# Patient Record
Sex: Male | Born: 1990 | Race: Black or African American | Hispanic: No | Marital: Single | Smoking: Current every day smoker
Health system: Southern US, Community
[De-identification: ages and names within clinical notes are randomized; demographics above are authoritative.]

---

## 2013-12-08 ENCOUNTER — Encounter (HOSPITAL_COMMUNITY): Payer: Self-pay

## 2013-12-08 ENCOUNTER — Emergency Department (HOSPITAL_COMMUNITY)
Admission: EM | Admit: 2013-12-08 | Discharge: 2013-12-08 | Disposition: A | Discharge status: Home / Self Care | Payer: BC Managed Care – PPO | Attending: Emergency Medicine | Admitting: Emergency Medicine

## 2013-12-08 DIAGNOSIS — J029 Acute pharyngitis, unspecified: Secondary | ICD-10-CM | POA: Diagnosis present

## 2013-12-08 DIAGNOSIS — K122 Cellulitis and abscess of mouth: Secondary | ICD-10-CM | POA: Diagnosis not present

## 2013-12-08 LAB — RAPID STREP SCREEN (MED CTR MEBANE ONLY): Streptococcus, Group A Screen (Direct): NEGATIVE

## 2013-12-08 MED ORDER — PREDNISONE 10 MG PO TABS: 20.0000 mg | ORAL_TABLET | Freq: Every day | ORAL | Status: AC

## 2013-12-08 MED ORDER — AMOXICILLIN-POT CLAVULANATE 875-125 MG PO TABS
1.0000 | ORAL_TABLET | Freq: Once | ORAL | Status: AC
  Administered 2013-12-08: 1 via ORAL
  Filled 2013-12-08: qty 1

## 2013-12-08 MED ORDER — DIPHENHYDRAMINE HCL 25 MG PO CAPS
50.0000 mg | ORAL_CAPSULE | Freq: Once | ORAL | Status: AC
  Administered 2013-12-08: 50 mg via ORAL
  Filled 2013-12-08: qty 2

## 2013-12-08 MED ORDER — AMOXICILLIN-POT CLAVULANATE 875-125 MG PO TABS: 1.0000 | ORAL_TABLET | Freq: Two times a day (BID) | ORAL | Status: DC

## 2013-12-08 MED ORDER — PREDNISONE 20 MG PO TABS
60.0000 mg | ORAL_TABLET | Freq: Once | ORAL | Status: AC
  Administered 2013-12-08: 60 mg via ORAL
  Filled 2013-12-08: qty 3

## 2013-12-08 NOTE — ED Provider Notes (Signed)
CSN: [id number]     Arrival date & time 12/08/13  0510 History   First MD Initiated Contact with Patient 12/08/13 [phone]     Chief Complaint  Patient presents with  . Sore Throat     (Consider location/radiation/quality/duration/timing/severity/associated sxs/prior Treatment) HPI 23 y.o. Male with complaints of sore throat.  Patient states he awoke his am with sore throat worse when he lays back.  It is moderate in severity.  He is able to swallow and breath without difficulty.  He felt ok when he went to sleep last night with no known sick exposures although he lives in a dorm.  He denies fever, headache, nasal congestion, cough, dyspnea, or muscle aches, or exposure to allergens.  He has not had a similar episode in the past.  He took otc ibuprofen without relief.   History reviewed. No pertinent past medical history. History reviewed. No pertinent past surgical history. History reviewed. No pertinent family history. History  Substance Use Topics  . Smoking status: Never Smoker   . Smokeless tobacco: Not on file  . Alcohol Use: No    Review of Systems  All other systems reviewed and are negative.     Allergies  Review of patient's allergies indicates no known allergies.  Home Medications   Prior to Admission medications   Medication Sig Start Date End Date Taking? Authorizing Provider  ibuprofen (ADVIL,MOTRIN) 200 MG tablet Take 600 mg by mouth every 6 (six) hours as needed for moderate pain.   Yes Historical Provider, MD   BP 144/65 mmHg  Pulse 63  Temp(Src) 98 F (36.7 C) (Oral)  Resp 20  Ht 5\' 11"  (1.803 m)  Wt 205 lb (92.987 kg)  BMI 28.60 kg/m2  SpO2 100% Physical Exam  Constitutional: He appears well-developed and well-nourished.  HENT:  Head: Normocephalic and atraumatic.  Right Ear: External ear normal.  Left Ear: External ear normal.  Mouth/Throat: Uvula is midline and mucous membranes are normal. Posterior oropharyngeal erythema present. No oropharyngeal  exudate, posterior oropharyngeal edema or tonsillar abscesses.    Uvula swollen without pus with some surrounding erythema Diffuse shotty adenopathy  Nursing note and vitals reviewed.   ED Course  Procedures (including critical care time) Labs Review Labs Reviewed - No data to display  Imaging Review No results found.   EKG Interpretation None      MDM   Final diagnoses:  Uvulitis   Plan prednisone and pcn.  This appears most c.w. Infectious etiology,  likely viral, but wii cover for strep and hib.   will also be covering for possible allergic reaction.  I have discussed return precautions and need for follow up with.  Patient voices understanding. Patient's voice is clear with airway intact.  No evidence of epiglottitis with erythema ending in pharynx on visual exam.     Hilario Quarryanielle S Teara Duerksen, MD 12/09/13 1102

## 2013-12-08 NOTE — Discharge Instructions (Signed)
Uvulitis  Uvulitis is redness and soreness (inflammation) of the uvula. The uvula is the small tongue-shaped piece of tissue in the back of your mouth.   CAUSES  Infection is a common cause of uvulitis. Infection of the uvula can be either viral or bacterial. Infectious uvulitis usually only occurs in association with another condition, such as inflammation and infection of the mouth or throat.   Other causes of uvulitis include:  · Trauma to the uvula.  · Swelling from excess fluid buildup (edema), which may be an allergic reaction.  · Inhalation of irritants, such as chemical agents, smoke, or steam.  DIAGNOSIS  Your caregiver can usually diagnose uvulitis through a physical examination. Bacterial uvulitis can be diagnosed through the results of the growth of samples of bodily substances taken from your mouth (cultures).  HOME CARE INSTRUCTIONS   · Rest as much as possible.  · Young children may suck on frozen juice bars or frozen ice pops. Older children and adults may gargle with a warm or cold liquid to help soothe the throat. (Mix ¼ tsp of salt in 8 oz of water, or use strong tea.)  · Use a cool-mist humidifier to lessen throat irritation and cough.  · Drink enough fluids to keep your urine clear or pale yellow.  · While the throat is very sore, eat soft or liquid foods such as milk, ice cream, soups, or milk drinks.  · Family members who develop a sore throat or fever should have a medical exam or throat culture.  · If your child has uvulitis and is taking antibiotic medicine, wait 24 hours or until his or her temperature is near normal (less than 100° F [37.8° C]) before allowing him or her to return to school or day care.  · Only take over-the-counter or prescription medicines for pain, discomfort, or fever as directed by your caregiver.  Ask when your test results will be ready. Make sure you get your test results.   SEEK MEDICAL CARE IF:   · You have an oral temperature above 102° F (38.9° C).  · You  develop large, tender lumps your the neck.  · Your child develops a rash.  · You cough up green, yellow-brown, or bloody substances.  SEEK IMMEDIATE MEDICAL CARE IF:   · You develop any new symptoms, such as vomiting, earache, severe headache, stiff neck, chest pain, or trouble breathing or swallowing.  · Your airway is blocked.  · You develop more severe throat pain along with drooling or voice changes.  Document Released: 08/21/2003 Document Revised: 04/04/2011 Document Reviewed: 03/18/2010  Exit[employer]® Patient Information ©2015 [employer]. This information is not intended to replace advice given to you by your health care provider. Make sure you discuss any questions you have with your health care provider.

## 2013-12-08 NOTE — ED Notes (Signed)
[facility], pt here for sore throat since yesterday. No fever. Throat is red.

## 2013-12-10 LAB — CULTURE, GROUP A STREP

## 2013-12-11 ENCOUNTER — Telehealth (HOSPITAL_COMMUNITY): Payer: Self-pay

## 2013-12-11 NOTE — ED Notes (Signed)
Post ED Visit - Positive Culture Follow-up  Culture report reviewed by antimicrobial stewardship pharmacist: []  Wes Dulaney, Pharm.D., BCPS [x]  Celedonio MiyamotoJeremy Frens, Pharm.D., BCPS []  Georgina PillionElizabeth Martin, Pharm.D., BCPS []  SimmsMinh Pham, [address]Ph[address]D., BCPS, AAHIVP []  Estella HuskMichelle Turner, Pharm.D., BCPS, AAHIVP []  Babs BertinHaley Baird, [address]Pharm[address]   Positive throat culture Treated with amoxicillin, organism sensitive to the same and no further patient follow-up is required at this time.  Ashley JacobsFesterman, Ericson Nafziger C 12/11/2013, 2:03 PM

## 2015-11-28 ENCOUNTER — Encounter (HOSPITAL_COMMUNITY): Payer: Self-pay | Admitting: Emergency Medicine

## 2015-11-28 ENCOUNTER — Ambulatory Visit (HOSPITAL_COMMUNITY)
Admission: EM | Admit: 2015-11-28 | Discharge: 2015-11-28 | Disposition: A | Discharge status: Home / Self Care | Payer: BLUE CROSS/BLUE SHIELD | Attending: Emergency Medicine | Admitting: Emergency Medicine

## 2015-11-28 DIAGNOSIS — R109 Unspecified abdominal pain: Secondary | ICD-10-CM

## 2015-11-28 NOTE — ED Provider Notes (Signed)
CSN: [id number]     Arrival date & time 11/28/15  1947 History   First MD Initiated Contact with Patient 11/28/15 2046     Chief Complaint  Patient presents with  . Flank Pain   (Consider location/radiation/quality/duration/timing/severity/associated sxs/prior Treatment) 25 year old male presents with pain to the right lateral abdomen radiating to the right anterior abdomen 2 days ago. He states that the prior day he was playing basketball but does not remember a specific injury to the abdomen. The pain is along the right costal margin to the right iliac crest and medially. It is positional and certain movements exacerbate the pain concern movements help relieve the pain. He states he can touch several areas and produce tenderness.      History reviewed. No pertinent past medical history. History reviewed. No pertinent surgical history. History reviewed. No pertinent family history. Social History  Substance Use Topics  . Smoking status: Current Every Day Smoker  . Smokeless tobacco: Never Used  . Alcohol use Yes    Review of Systems  Constitutional: Negative.   HENT: Negative.   Respiratory: Negative.  Negative for cough, chest tightness and shortness of breath.   Cardiovascular: Negative for chest pain.  Gastrointestinal: Positive for abdominal pain. Negative for abdominal distention, blood in stool, diarrhea, nausea and vomiting.  Genitourinary: Negative.   Musculoskeletal: Negative.   Skin: Negative.   Neurological: Negative.     Allergies  Review of patient's allergies indicates no known allergies.  Home Medications   Prior to Admission medications   Medication Sig Start Date End Date Taking? Authorizing Provider  ibuprofen (ADVIL,MOTRIN) 200 MG tablet Take 600 mg by mouth every 6 (six) hours as needed for moderate pain.    Historical Provider, MD   Meds Ordered and Administered this Visit  Medications - No data to display  BP (!) 128/49 (BP Location: Right Arm)    Pulse 72   Temp 98.4 F (36.9 C) (Oral)   Resp 18   SpO2 97%  No data found.   Physical Exam  Constitutional: He is oriented to person, place, and time. He appears well-developed and well-nourished. No distress.  HENT:  Head: Normocephalic and atraumatic.  Neck: Normal range of motion. Neck supple.  Cardiovascular: Normal rate, regular rhythm and normal heart sounds.   Pulmonary/Chest: Effort normal and breath sounds normal. No respiratory distress.  Abdominal: Soft.  Tenderness over the right iliac crest and right costal margin. Tenderness posterior along the posterior axillary line and musculature of the right back. There is also tenderness medially just right of the mid sagittal line of the abdomen. No rebound or guarding.  Musculoskeletal: Normal range of motion. He exhibits no edema or deformity.  Neurological: He is alert and oriented to person, place, and time.  Skin: Skin is warm.  Psychiatric: He has a normal mood and affect.  Nursing note and vitals reviewed.   Urgent Care Course   Clinical Course    Procedures (including critical care time)  Labs Review Labs Reviewed - No data to display  Imaging Review No results found.   Visual Acuity Review  Right Eye Distance:   Left Eye Distance:   Bilateral Distance:    Right Eye Near:   Left Eye Near:    Bilateral Near:         MDM   1. Right flank pain   2. Abdominal wall pain in right flank    The most likely explanation for her pain is abdominal wall and muscle  type pain. Apply heat to the area pain and may take Tylenol or ibuprofen for pain. If there is any worsening of pain if you develop nausea vomiting, blood in the stool, dark hard stools or any bleeding, complete lack of appetite, fever or worsening in any way go to the emergency department promptly.     Hayden Rasmussenavid Truc Winfree, NP 11/28/15 2110    Hayden Rasmussenavid Cortez Flippen, NP 11/28/15 2118

## 2015-11-28 NOTE — Discharge Instructions (Signed)
The most likely explanation for her pain is abdominal wall and muscle type pain. Apply heat to the area pain and may take Tylenol or ibuprofen for pain. If there is any worsening of pain if you develop nausea vomiting, blood in the stool, dark hard stools or any bleeding, complete lack of appetite, fever or worsening in any way go to the emergency department promptly.

## 2015-11-28 NOTE — ED Triage Notes (Signed)
Here for constant right flank pain onset 2-3 days  Reports it increases w/activity  Denies inj/trauma, urinary sx  A&O x4... NAD

## 2015-12-03 ENCOUNTER — Encounter (HOSPITAL_COMMUNITY): Payer: Self-pay

## 2015-12-03 ENCOUNTER — Emergency Department (HOSPITAL_COMMUNITY)
Admission: EM | Admit: 2015-12-03 | Discharge: 2015-12-04 | Disposition: A | Discharge status: Home / Self Care | Payer: BLUE CROSS/BLUE SHIELD | Attending: Emergency Medicine | Admitting: Emergency Medicine

## 2015-12-03 DIAGNOSIS — R1084 Generalized abdominal pain: Secondary | ICD-10-CM | POA: Diagnosis not present

## 2015-12-03 DIAGNOSIS — R1013 Epigastric pain: Secondary | ICD-10-CM | POA: Diagnosis present

## 2015-12-03 DIAGNOSIS — F17200 Nicotine dependence, unspecified, uncomplicated: Secondary | ICD-10-CM | POA: Diagnosis not present

## 2015-12-03 LAB — COMPREHENSIVE METABOLIC PANEL
ALBUMIN: 4 g/dL (ref 3.5–5.0)
ALK PHOS: 89 U/L (ref 38–126)
ALT: 14 U/L — ABNORMAL LOW (ref 17–63)
AST: 20 U/L (ref 15–41)
Anion gap: 5 (ref 5–15)
BUN: 13 mg/dL (ref 6–20)
CALCIUM: 9.3 mg/dL (ref 8.9–10.3)
CO2: 28 mmol/L (ref 22–32)
Chloride: 108 mmol/L (ref 101–111)
Creatinine, Ser: 1.08 mg/dL (ref 0.61–1.24)
GFR calc Af Amer: >60 mL/min (ref >60)
GFR calc non Af Amer: >60 mL/min (ref >60)
GLUCOSE: 104 mg/dL — AB (ref 65–99)
POTASSIUM: 4.1 mmol/L (ref 3.5–5.1)
SODIUM: 141 mmol/L (ref 135–145)
Total Bilirubin: 0.8 mg/dL (ref 0.3–1.2)
Total Protein: 6.5 g/dL (ref 6.5–8.1)

## 2015-12-03 LAB — CBC
HCT: 40.9 % (ref 39.0–52.0)
HEMOGLOBIN: 13.3 g/dL (ref 13.0–17.0)
MCH: 27.7 pg (ref 26.0–34.0)
MCHC: 32.5 g/dL (ref 30.0–36.0)
MCV: 85 fL (ref 78.0–100.0)
Platelets: 125 10*3/uL — ABNORMAL LOW (ref 150–400)
RBC: 4.81 MIL/uL (ref 4.22–5.81)
RDW: 13.9 % (ref 11.5–15.5)
WBC: 5.8 10*3/uL (ref 4.0–10.5)

## 2015-12-03 LAB — LIPASE, BLOOD: Lipase: 31 U/L (ref 11–51)

## 2015-12-03 MED ORDER — ACETAMINOPHEN 500 MG PO TABS
1000.0000 mg | ORAL_TABLET | Freq: Once | ORAL | Status: AC
  Administered 2015-12-03: 1000 mg via ORAL
  Filled 2015-12-03: qty 2

## 2015-12-03 NOTE — ED Provider Notes (Signed)
[facility]-EMERGENCY DEPT Provider Note   CSN: [id number] Arrival date & time: 12/03/15  1843     History   Chief Complaint Chief Complaint  Patient presents with  . Flank Pain    HPI Adele BarthelMaurice Bernhart Jr. is a 25 y.o. male.Complains of epigastric pain radiating to lower abdomen onset one week ago. Pain waxes and wanes presently is mild. Gets worse with lying in certain positions and improved with lying in other positions. No anorexia no fever no nausea or vomiting last bowel movement this morning, normal no urinary symptoms. No treatment prior to coming here. No other associated symptoms  HPI  History reviewed. No pertinent past medical history. Past medical history negative There are no active problems to display for this patient.   History reviewed. No pertinent surgical history.     Home Medications    Prior to Admission medications   Medication Sig Start Date End Date Taking? Authorizing Provider  ibuprofen (ADVIL,MOTRIN) 200 MG tablet Take 600 mg by mouth every 6 (six) hours as needed for moderate pain.   Yes Historical Provider, MD    Family History No family history on file.  Social History Social History  Substance Use Topics  . Smoking status: Current Every Day Smoker  . Smokeless tobacco: Never Used  . Alcohol use Yes  Positive marijuana use   Allergies   Patient has no known allergies.   Review of Systems Review of Systems  Constitutional: Negative.   HENT: Negative.   Respiratory: Negative.   Cardiovascular: Negative for chest pain.       Syncope  Gastrointestinal: Positive for abdominal pain.  Musculoskeletal: Negative.   Skin: Negative.   Allergic/Immunologic: Negative.   Neurological: Negative.   Psychiatric/Behavioral: Negative.   All other systems reviewed and are negative.    Physical Exam Updated Vital Signs BP 145/87   Pulse (!) 55   Temp 98.6 F (37 C) (Oral)   Resp 18   Ht 5\' 11"  (1.803 m)   Wt 200 lb (90.7 kg)   SpO2 100%    BMI 27.89 kg/m   Physical Exam  Constitutional: He appears well-developed and well-nourished. No distress.  HENT:  Head: Normocephalic and atraumatic.  Eyes: Conjunctivae are normal. Pupils are equal, round, and reactive to light.  Neck: Neck supple. No tracheal deviation present. No thyromegaly present.  Cardiovascular: Normal rate and regular rhythm.   No murmur heard. Pulmonary/Chest: Effort normal and breath sounds normal.  Abdominal: Soft. Bowel sounds are normal. He exhibits no distension.  Minimal periumbilical tenderness  Genitourinary: Penis normal.  Genitourinary Comments: Scrotum normal. No flank tenderness  Musculoskeletal: Normal range of motion. He exhibits no edema or tenderness.  Neurological: He is alert. Coordination normal.  Skin: Skin is warm and dry. No rash noted.  Psychiatric: He has a normal mood and affect.  Nursing note and vitals reviewed.    ED Treatments / Results  Labs (all labs ordered are listed, but only abnormal results are displayed) Labs Reviewed  COMPREHENSIVE METABOLIC PANEL - Abnormal; Notable for the following:       Result Value   Glucose, Bld 104 (*)    ALT 14 (*)    All other components within normal limits  CBC - Abnormal; Notable for the following:    Platelets 125 (*)    All other components within normal limits  LIPASE, BLOOD  URINALYSIS, ROUTINE W REFLEX MICROSCOPIC (NOT AT [facility])    EKG  EKG Interpretation None  Radiology No results found.  Procedures Procedures (including critical care time)  Medications Ordered in ED Medications  acetaminophen (TYLENOL) tablet 1,000 mg (1,000 mg Oral Given 12/03/15 2339)    X-rays viewed by me Results for orders placed or performed during the hospital encounter of 12/03/15  Lipase, blood  Result Value Ref Range   Lipase 31 11 - 51 U/L  Comprehensive metabolic panel  Result Value Ref Range   Sodium 141 135 - 145 mmol/L   Potassium 4.1 3.5 - 5.1 mmol/L   Chloride  108 101 - 111 mmol/L   CO2 28 22 - 32 mmol/L   Glucose, Bld 104 (H) 65 - 99 mg/dL   BUN 13 6 - 20 mg/dL   Creatinine, Ser 1.61 0.61 - 1.24 mg/dL   Calcium 9.3 8.9 - 09.6 mg/dL   Total Protein 6.5 6.5 - 8.1 g/dL   Albumin 4.0 3.5 - 5.0 g/dL   AST 20 15 - 41 U/L   ALT 14 (L) 17 - 63 U/L   Alkaline Phosphatase 89 38 - 126 U/L   Total Bilirubin 0.8 0.3 - 1.2 mg/dL   GFR calc non Af Amer >60 >60 mL/min   GFR calc Af Amer >60 >60 mL/min   Anion gap 5 5 - 15  CBC  Result Value Ref Range   WBC 5.8 4.0 - 10.5 K/uL   RBC 4.81 4.22 - 5.81 MIL/uL   Hemoglobin 13.3 13.0 - 17.0 g/dL   HCT 04.5 40.9 - 81.1 %   MCV 85.0 78.0 - 100.0 fL   MCH 27.7 26.0 - 34.0 pg   MCHC 32.5 30.0 - 36.0 g/dL   RDW 91.4 78.2 - 95.6 %   Platelets 125 (L) 150 - 400 K/uL  Urinalysis, Routine w reflex microscopic  Result Value Ref Range   Color, Urine YELLOW YELLOW   APPearance CLOUDY (A) CLEAR   Specific Gravity, Urine 1.028 1.005 - 1.030   pH 6.0 5.0 - 8.0   Glucose, UA NEGATIVE NEGATIVE mg/dL   Hgb urine dipstick NEGATIVE NEGATIVE   Bilirubin Urine NEGATIVE NEGATIVE   Ketones, ur NEGATIVE NEGATIVE mg/dL   Protein, ur NEGATIVE NEGATIVE mg/dL   Nitrite NEGATIVE NEGATIVE   Leukocytes, UA NEGATIVE NEGATIVE   Dg Abd Acute W/chest  Result Date: 12/04/2015 CLINICAL DATA:  Abdominal pain EXAM: DG ABDOMEN ACUTE W/ 1V CHEST COMPARISON:  None. FINDINGS: Cardiomediastinal contours are normal. No pneumothorax or pleural effusion. No focal airspace consolidation or pulmonary edema. No free intraperitoneal air. There is stool seen within the ascending colon. No dilated loops of small bowel. No abnormal calcifications or mass is identified. IMPRESSION: Negative abdominal radiographs.  No acute cardiopulmonary disease. Electronically Signed   By: Deatra Robinson M.D.   On: 12/04/2015 00:36   Initial Impression / Assessment and Plan / ED Course  I have reviewed the triage vital signs and the nursing notes.  Pertinent labs &  imaging results that were available during my care of the patient were reviewed by me and considered in my medical decision making (see chart for details).  Clinical Course     12:40 AM Pain unchanged after treatment with Tylenol patient appears comfortable and in no distress. Feel that further imaging is needed. Tenderness is minimal, pain is minimal. Strongly doubt appendicitis after 1 week of symptoms with normal appetite, no right lower quadrant pain or tenderness. No anorexia no fever, no leukocytosis. Doubt acute cholecystitis. Normal LFTs, normal lipase, pain not affected by eating. No fever.  Pain felt to be nonspecific. He'll be referred to get a primary care physician. Plan Tylenol for pain. Final Clinical Impressions(s) / ED Diagnoses  Diagnosis nonspecific generalized abdominal pain Final diagnoses:  None  #2Thrombocytopenia  New Prescriptions New Prescriptions   No medications on file     Doug SouSam Jacarius Handel, MD 12/04/15 [phone]

## 2015-12-03 NOTE — ED Triage Notes (Signed)
Pt reports right flank pain and right sided abdominal pain that started one week ago. Denies n/v/d, denies any abnormal urinary symptoms.

## 2015-12-04 ENCOUNTER — Emergency Department (HOSPITAL_COMMUNITY): Payer: BLUE CROSS/BLUE SHIELD

## 2015-12-04 DIAGNOSIS — R1084 Generalized abdominal pain: Secondary | ICD-10-CM | POA: Diagnosis present

## 2015-12-04 LAB — URINALYSIS, ROUTINE W REFLEX MICROSCOPIC
BILIRUBIN URINE: NEGATIVE
Glucose, UA: NEGATIVE mg/dL
HGB URINE DIPSTICK: NEGATIVE
Ketones, ur: NEGATIVE mg/dL
Leukocytes, UA: NEGATIVE
Nitrite: NEGATIVE
PROTEIN: NEGATIVE mg/dL
SPECIFIC GRAVITY, URINE: 1.028 (ref 1.005–1.030)
pH: 6 (ref 5.0–8.0)

## 2015-12-04 NOTE — Discharge Instructions (Signed)
Tylenol as directed for pain. Blood platelet count was mildly low today at 125,000. Call any of the numbers listed to get a primary care physician. Your primary care physician should recheck your blood platelet count within the next 6 months .You can also call [facility] to schedule office visit if you wish to see a specialist for digestive problems.

## 2015-12-04 NOTE — ED Notes (Signed)
Pt stable, understands discharge instructions, and reasons for return.   

## 2017-12-19 IMAGING — DX DG ABDOMEN ACUTE W/ 1V CHEST
3 series · 3 of 3 positions shown · non-contrast
Comparison: None.

CLINICAL DATA: Abdominal pain

EXAM:
DG ABDOMEN ACUTE W/ 1V CHEST

[chest pa]
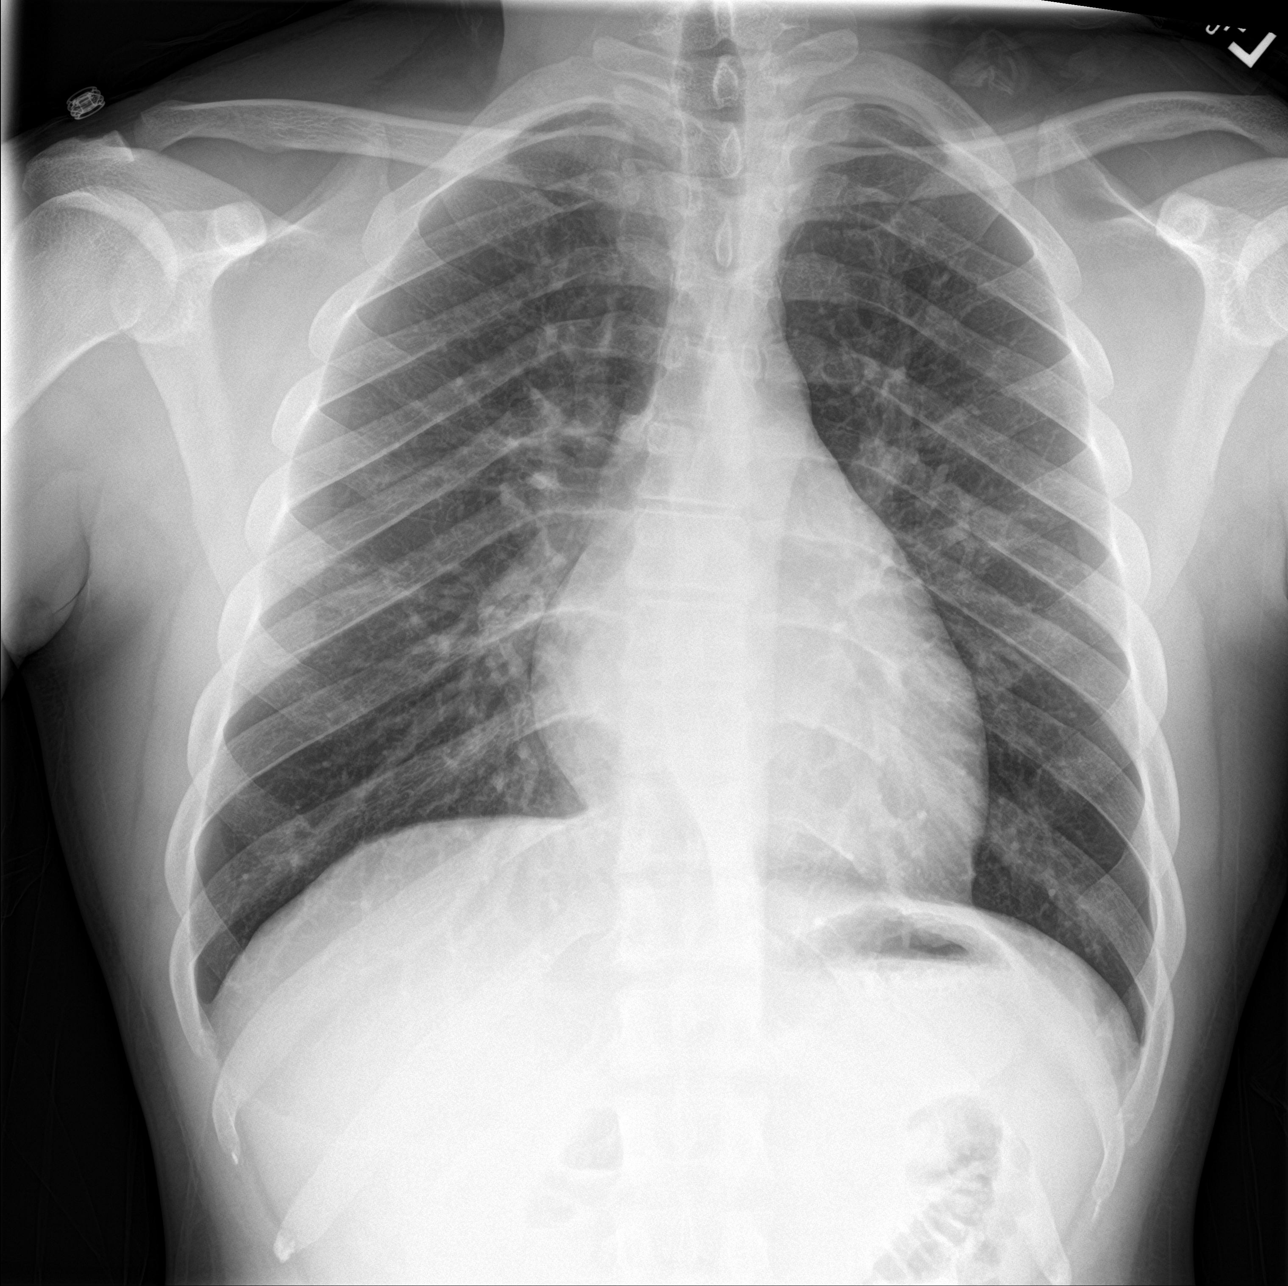

[abdomen erect]
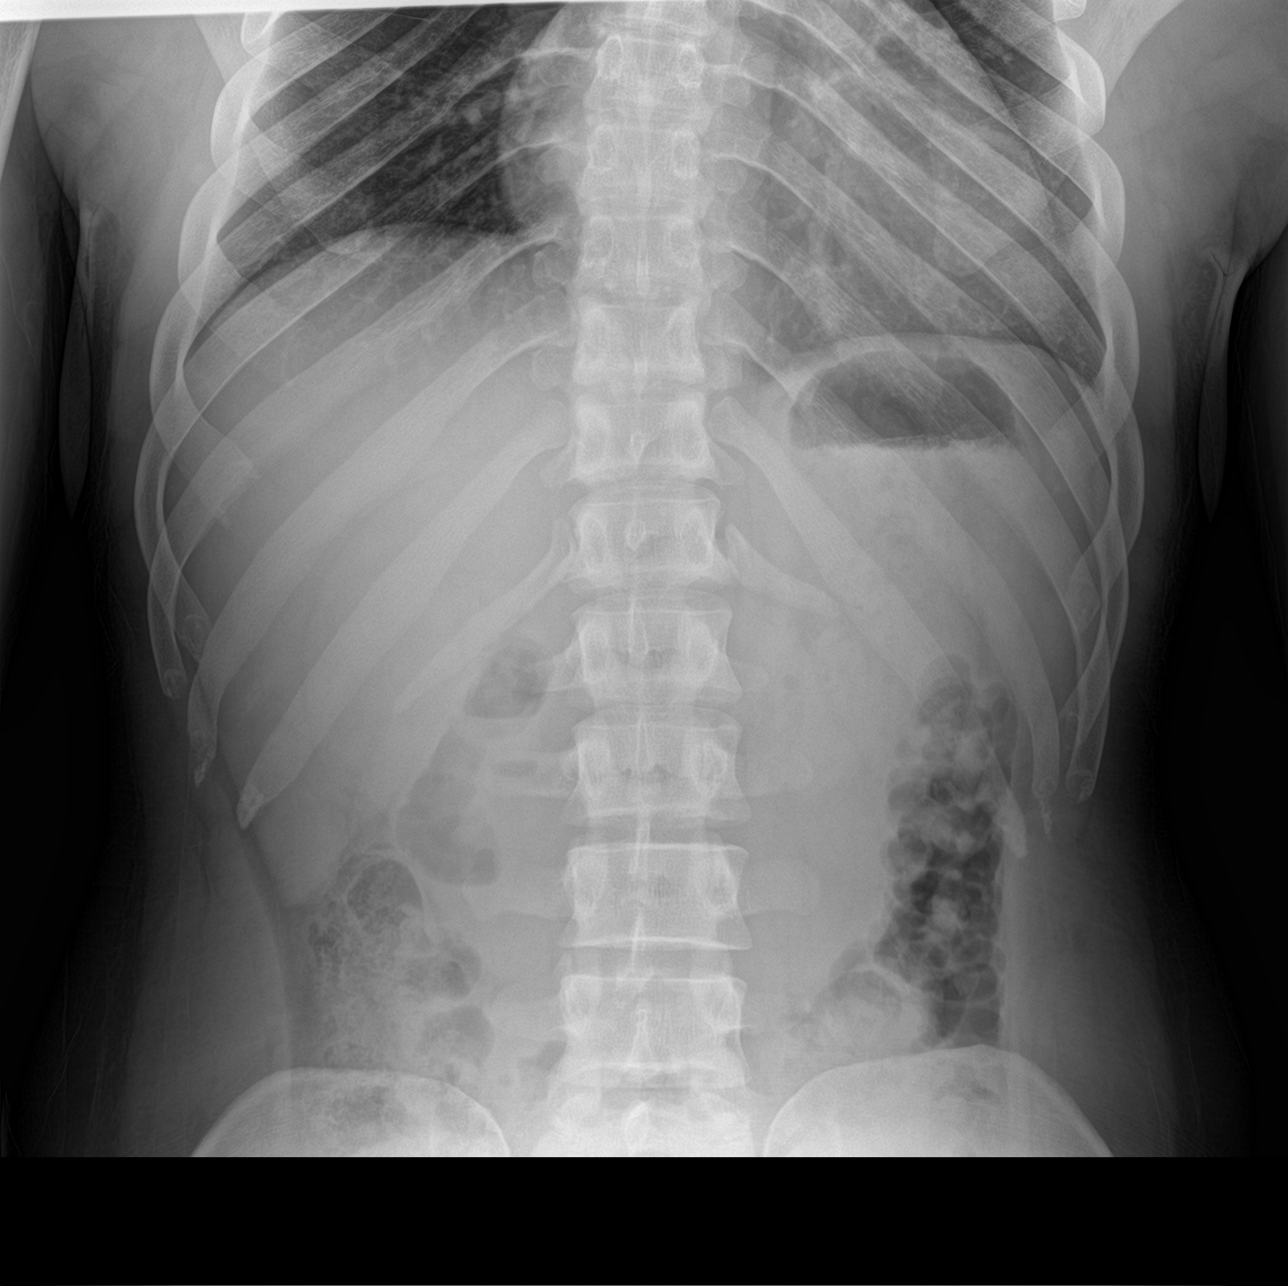

[abdomen supine]
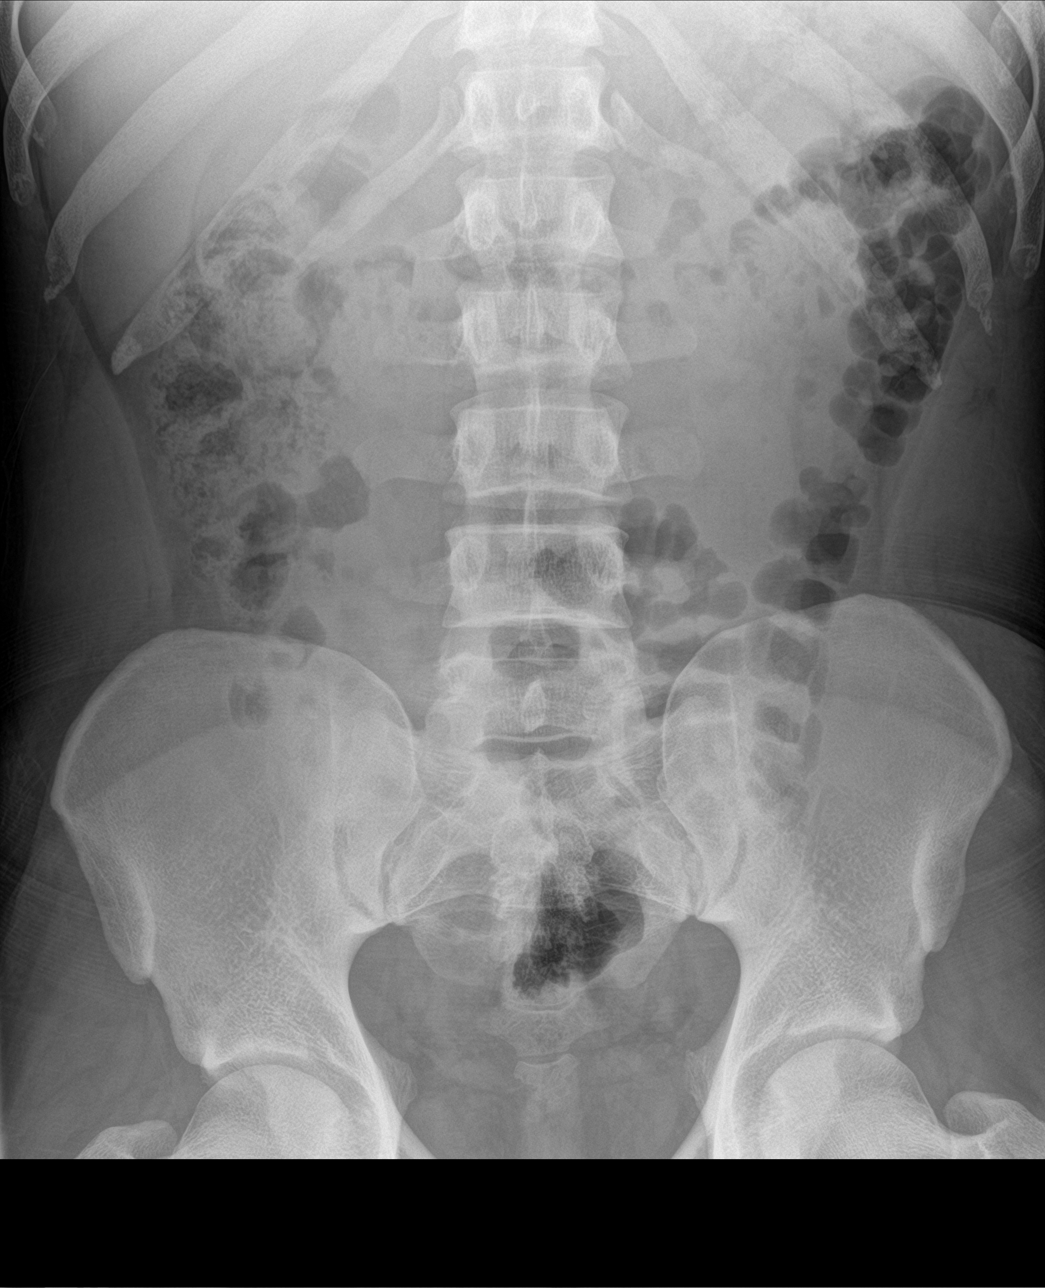

[3 of 3 positions shown; findings below may reference images not displayed]

FINDINGS: Cardiomediastinal contours are normal. No pneumothorax or pleural
effusion.

No focal airspace consolidation or pulmonary edema.

No free intraperitoneal air. There is stool seen within the
ascending colon. No dilated loops of small bowel. No abnormal
calcifications or mass is identified.
IMPRESSION: Negative abdominal radiographs.  No acute cardiopulmonary disease.
